# Patient Record
Sex: Male | Born: 2000 | Race: White | Hispanic: No | Marital: Single
Health system: Southern US, Community
[De-identification: ages and names within clinical notes are randomized; demographics above are authoritative.]

---
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
## 2023-06-17 ENCOUNTER — Ambulatory Visit
Admission: RE | Admit: 2023-06-17 | Discharge: 2023-06-17 | Disposition: A | Discharge status: Home / Self Care | Payer: Self-pay | Source: Ambulatory Visit | Attending: Emergency Medicine | Admitting: Emergency Medicine

## 2023-06-17 VITALS — BP 131/78 | HR 100 | Temp 98.7°F | Resp 17

## 2023-06-17 DIAGNOSIS — L03316 Cellulitis of umbilicus: Secondary | ICD-10-CM

## 2023-06-17 MED ORDER — DOXYCYCLINE HYCLATE 100 MG PO CAPS: 100.0000 mg | ORAL_CAPSULE | Freq: Two times a day (BID) | ORAL | 0 refills | Status: AC

## 2023-06-17 NOTE — Discharge Instructions (Addendum)
 Take the Doxycycline twice daily with food for 7 days.  Doxycycline will make you more sensitive to sunburn so wear sunscreen when outdoors and reapply it every 90 minutes.  Apply warm compresses to help promote drainage.  Use OTC Tylenol and Ibuprofen according to the package instructions as needed for pain.  Return for new or worsening symptoms.

## 2023-06-17 NOTE — ED Provider Notes (Signed)
 [facility]    CSN: [id number] Arrival date & time: 06/17/23  1520      History   Chief Complaint Chief Complaint  Patient presents with   Abdominal Pain    Possible umbilical hernia? - Entered by patient   Abscess    HPI Aaron Rhodes is a 23 y.o. male.   HPI  23 year old male with no significant past medical history presents for evaluation of redness, pain, and swelling to his bellybutton that he first noticed 3 days ago.  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  He denies fever but he has had some mild nausea.  He also noticed that he has some serosanguineous drainage from the wound.  History reviewed. No pertinent past medical history.  There are no active problems to display for this patient.   History reviewed. No pertinent surgical history.     Home Medications    Prior to Admission medications   Medication Sig Start Date End Date Taking? Authorizing Provider  doxycycline (VIBRAMYCIN) 100 MG capsule Take 1 capsule (100 mg total) by mouth 2 (two) times daily for 7 days. 06/17/23 06/24/23 Yes Becky Augusta, NP    Family History Family History  Problem Relation Age of Onset   Healthy Mother    Diabetes Father     Social History Social History   Tobacco Use   Smoking status: Passive Smoke Exposure - Never Smoker   Smokeless tobacco: Never   Tobacco comments:    father smokes outside  Vaping Use   Vaping status: Never Used  Substance Use Topics   Alcohol use: Never   Drug use: Never     Allergies   Patient has no known allergies.   Review of Systems Review of Systems  Constitutional:  Negative for fever.  Skin:  Positive for color change.     Physical Exam Triage Vital Signs ED Triage Vitals  Encounter Vitals Group     BP      Systolic BP Percentile      Diastolic BP Percentile      Pulse      Resp      Temp      Temp src      SpO2      Weight       Height      Head Circumference      Peak Flow      Pain Score      Pain Loc      Pain Education      Exclude from Growth Chart    No data found.  Updated Vital Signs BP 131/78 (BP Location: Right Arm)   Pulse 100   Temp 98.7 F (37.1 C) (Oral)   Resp 17   SpO2 97%   Visual Acuity Right Eye Distance:   Left Eye Distance:   Bilateral Distance:    Right Eye Near:   Left Eye Near:    Bilateral Near:     Physical Exam Vitals and nursing note reviewed.  Constitutional:      Appearance: Normal appearance.  Skin:    General: Skin is warm and dry.     Capillary Refill: Capillary refill takes less than 2 seconds.     Findings: Erythema present.  Neurological:     General: No focal deficit present.     Mental Status: He is alert and oriented to person, place, and time.  UC Treatments / Results  Labs (all labs ordered are listed, but only abnormal results are displayed) Labs Reviewed - No data to display  EKG   Radiology No results found.  Procedures Procedures (including critical care time)  Medications Ordered in UC Medications - No data to display  Initial Impression / Assessment and Plan / UC Course  I have reviewed the triage vital signs and the nursing notes.  Pertinent labs & imaging results that were available during my care of the patient were reviewed by me and considered in my medical decision making (see chart for details).   Pleasant, nontoxic-appearing 23 year old male presenting for evaluation of redness, pain, and drainage from his bellybutton that he first noticed 3 days ago.  As you seen image above, the patient's umbilicus is markedly erythematous and edematous with serosanguineous drainage from the bellybutton.  There is also erythema with warmth and mild induration to the abdominal wall underneath the bellybutton.  I will treat the patient for umbilical cellulitis with doxycycline 100 mg twice daily for 7 days.  I have encouraged him to  apply warm compresses to the area to see if he can encourage further drainage of the infection.  He may use over-the-counter Tylenol and/or ibuprofen as needed for any pain.  Return and ER precautions reviewed.  Final Clinical Impressions(s) / UC Diagnoses   Final diagnoses:  Cellulitis, umbilical     Discharge Instructions      Take the Doxycycline twice daily with food for 7 days.  Doxycycline will make you more sensitive to sunburn so wear sunscreen when outdoors and reapply it every 90 minutes.  Apply warm compresses to help promote drainage.  Use OTC Tylenol and Ibuprofen according to the package instructions as needed for pain.  Return for new or worsening symptoms.       ED Prescriptions     Medication Sig Dispense Auth. Provider   doxycycline (VIBRAMYCIN) 100 MG capsule Take 1 capsule (100 mg total) by mouth 2 (two) times daily for 7 days. 14 capsule Becky Augusta, NP      PDMP not reviewed this encounter.   Becky Augusta, NP 06/17/23 1550

## 2023-06-17 NOTE — ED Triage Notes (Addendum)
 Patient has noticed a bump inside belly button x 3 days. Area is red and hard. Patient works outside and sweats a lot.

## 2023-07-18 ENCOUNTER — Ambulatory Visit
Admission: RE | Admit: 2023-07-18 | Discharge: 2023-07-18 | Disposition: A | Discharge status: Home / Self Care | Source: Ambulatory Visit | Attending: Emergency Medicine | Admitting: Emergency Medicine

## 2023-07-18 VITALS — BP 121/68 | HR 80 | Temp 98.3°F | Resp 16

## 2023-07-18 DIAGNOSIS — L03316 Cellulitis of umbilicus: Secondary | ICD-10-CM | POA: Diagnosis not present

## 2023-07-18 MED ORDER — DOXYCYCLINE HYCLATE 100 MG PO CAPS: 100.0000 mg | ORAL_CAPSULE | Freq: Two times a day (BID) | ORAL | 0 refills | Status: AC

## 2023-07-18 MED ORDER — MUPIROCIN 2 % EX OINT: 1.0000 | TOPICAL_OINTMENT | Freq: Two times a day (BID) | CUTANEOUS | 0 refills | Status: DC

## 2023-07-18 NOTE — ED Provider Notes (Signed)
 [facility]    CSN: [id number] Arrival date & time: 07/18/23  1705      History   Chief Complaint Chief Complaint  Patient presents with   Allergic Reaction    Previously diagnosed there as cellulitis in navel - Entered by patient    HPI Aaron Rhodes is a 23 y.o. male.   HPI  23 year old male with no significant past medical history presents for evaluation of pain, redness, and swelling to his bellybutton that he first noticed last night when he got home from work and got the shower.  When he woke up this morning it continued to be red and throughout the day it has become more tender.  He denies any fever or drainage.  He was seen for the same presentation 30 days ago and treated for cellulitis with doxycycline .  History reviewed. No pertinent past medical history.  There are no active problems to display for this patient.   History reviewed. No pertinent surgical history.     Home Medications    Prior to Admission medications   Medication Sig Start Date End Date Taking? Authorizing Provider  doxycycline  (VIBRAMYCIN ) 100 MG capsule Take 1 capsule (100 mg total) by mouth 2 (two) times daily for 10 days. 07/18/23 07/28/23 Yes Kent Pear, NP  mupirocin ointment (BACTROBAN) 2 % Apply 1 Application topically 2 (two) times daily. 07/18/23  Yes Kent Pear, NP    Family History Family History  Problem Relation Age of Onset   Healthy Mother    Diabetes Father     Social History Social History   Tobacco Use   Smoking status: Passive Smoke Exposure - Never Smoker   Smokeless tobacco: Never   Tobacco comments:    father smokes outside  Vaping Use   Vaping status: Never Used  Substance Use Topics   Alcohol use: Never   Drug use: Never     Allergies   Patient has no known allergies.   Review of Systems Review of Systems  Constitutional:  Negative for fever.  Skin:  Positive for color change. Negative for wound.     Physical Exam Triage  Vital Signs ED Triage Vitals  Encounter Vitals Group     BP      Systolic BP Percentile      Diastolic BP Percentile      Pulse      Resp      Temp      Temp src      SpO2      Weight      Height      Head Circumference      Peak Flow      Pain Score      Pain Loc      Pain Education      Exclude from Growth Chart    No data found.  Updated Vital Signs BP 121/68 (BP Location: Right Arm)   Pulse 80   Temp 98.3 F (36.8 C) (Oral)   Resp 16   SpO2 98%   Visual Acuity Right Eye Distance:   Left Eye Distance:   Bilateral Distance:    Right Eye Near:   Left Eye Near:    Bilateral Near:     Physical Exam Vitals and nursing note reviewed.  Constitutional:      Appearance: Normal appearance. He is not ill-appearing.  HENT:     Head: Normocephalic and atraumatic.  Skin:    General: Skin is warm and dry.  Capillary Refill: Capillary refill takes less than 2 seconds.     Findings: Erythema present.  Neurological:     General: No focal deficit present.     Mental Status: He is alert and oriented to person, place, and time.      UC Treatments / Results  Labs (all labs ordered are listed, but only abnormal results are displayed) Labs Reviewed - No data to display  EKG   Radiology No results found.  Procedures Procedures (including critical care time)  Medications Ordered in UC Medications - No data to display  Initial Impression / Assessment and Plan / UC Course  I have reviewed the triage vital signs and the nursing notes.  Pertinent labs & imaging results that were available during my care of the patient were reviewed by me and considered in my medical decision making (see chart for details).   Patient is a pleasant, nontoxic-appearing 23 year old male presenting for evaluation of erythema to his umbilical region as outlined HPI above.  Patient was seen image above, the umbilicus itself is erythematous and there is erythema surrounding the  inferior tissue.  This tissue is warm to touch but there is no induration or fluctuance.  I do not appreciate any wounds on the umbilicus that might have been portal of entry for bacteria.  I will treat the patient for cellulitis of his umbilicus with a 10-day course of doxycycline  100 looms twice daily as well as a 5-day course of twice daily mupirocin topical antibiotic ointment.  Return precautions reviewed.   Final Clinical Impressions(s) / UC Diagnoses   Final diagnoses:  Cellulitis of umbilicus     Discharge Instructions      Take the doxycycline  twice daily with food for 10 days for treatment of your infected bellybutton.  Apply mupirocin ointment using a clean Q-tip twice daily to your bellybutton for the next 5 days.  Monitor for any increases in redness, swelling, pus drainage, or fever.  If any of these conditions arise please return for reevaluation.     ED Prescriptions     Medication Sig Dispense Auth. Provider   doxycycline  (VIBRAMYCIN ) 100 MG capsule Take 1 capsule (100 mg total) by mouth 2 (two) times daily for 10 days. 20 capsule Kent Pear, NP   mupirocin ointment (BACTROBAN) 2 % Apply 1 Application topically 2 (two) times daily. 22 g Kent Pear, NP      PDMP not reviewed this encounter.   Kent Pear, NP 07/18/23 [phone]

## 2023-07-18 NOTE — ED Triage Notes (Signed)
 Skin infection Belly button  Hot to touch  Came about a month ago for the same reason

## 2023-07-18 NOTE — Discharge Instructions (Signed)
 Take the doxycycline  twice daily with food for 10 days for treatment of your infected bellybutton.  Apply mupirocin ointment using a clean Q-tip twice daily to your bellybutton for the next 5 days.  Monitor for any increases in redness, swelling, pus drainage, or fever.  If any of these conditions arise please return for reevaluation.

## 2024-03-26 ENCOUNTER — Encounter: Payer: Self-pay | Admitting: Emergency Medicine

## 2024-03-26 ENCOUNTER — Ambulatory Visit
Admission: EM | Admit: 2024-03-26 | Discharge: 2024-03-26 | Disposition: A | Discharge status: Home / Self Care | Attending: Emergency Medicine | Admitting: Emergency Medicine

## 2024-03-26 DIAGNOSIS — J111 Influenza due to unidentified influenza virus with other respiratory manifestations: Secondary | ICD-10-CM

## 2024-03-26 MED ORDER — BENZONATATE 100 MG PO CAPS: 200.0000 mg | ORAL_CAPSULE | Freq: Three times a day (TID) | ORAL | 0 refills | Status: AC

## 2024-03-26 MED ORDER — PROMETHAZINE-DM 6.25-15 MG/5ML PO SYRP: 5.0000 mL | ORAL_SOLUTION | Freq: Four times a day (QID) | ORAL | 0 refills | Status: AC | PRN

## 2024-03-26 MED ORDER — OSELTAMIVIR PHOSPHATE 75 MG PO CAPS: 75.0000 mg | ORAL_CAPSULE | Freq: Two times a day (BID) | ORAL | 0 refills | Status: AC

## 2024-03-26 MED ORDER — IPRATROPIUM BROMIDE 0.06 % NA SOLN: 2.0000 | Freq: Four times a day (QID) | NASAL | 12 refills | Status: AC

## 2024-03-26 NOTE — Discharge Instructions (Signed)
 We are treating you presumptively for influenza given your flu exposure and your symptoms.  Please use over-the-counter Tylenol and or ibuprofen according to the package instructions as needed for any fever or pain.  Take the Tamiflu  twice daily for 5 days for treatment of influenza.  Use the Atrovent  nasal spray, 2 squirts up each nostril every 6 hours, as needed for nasal congestion and runny nose.  Use over-the-counter Delsym, Zarbee's, or Robitussin during the day as needed for cough.  Use the Tessalon  Perles every 8 hours as needed for cough.  Taken with a small sip of water.  You may experience some numbness to your tongue or metallic taste in your mouth, this is normal.  Use the Promethazine  DM cough syrup at bedtime as will make you drowsy but it should help dry up your postnasal drip and aid you in sleep and cough relief.  Return for reevaluation, or see your primary care provider, for new or worsening symptoms.

## 2024-03-26 NOTE — ED Triage Notes (Addendum)
 Pt c/o body aches, nasal congestion, headache, and fever. Started last night. He has been in contact with someone that tested positive for the flu.

## 2024-03-26 NOTE — ED Provider Notes (Signed)
 " [facility]    CSN: [id number] Arrival date & time: 03/26/24  0907      History   Chief Complaint Chief Complaint  Patient presents with   Generalized Body Aches   Fever    HPI Aaron Rhodes is a 24 y.o. male.   HPI  24 year old male with no significant past medical history presents for evaluation of flulike symptoms that began last night after having a flu exposure.  He reports a fever with a Tmax of 102.9.  Headaches, body aches, nasal congestion with thick clear nasal discharge, productive cough intermittently for clear sputum.  No shortness of breath or wheezing.  History reviewed. No pertinent past medical history.  There are no active problems to display for this patient.   History reviewed. No pertinent surgical history.     Home Medications    Prior to Admission medications  Medication Sig Start Date End Date Taking? Authorizing Provider  benzonatate  (TESSALON ) 100 MG capsule Take 2 capsules (200 mg total) by mouth every 8 (eight) hours. 03/26/24  Yes Bernardino Ditch, NP  ipratropium (ATROVENT ) 0.06 % nasal spray Place 2 sprays into both nostrils 4 (four) times daily. 03/26/24  Yes Bernardino Ditch, NP  oseltamivir  (TAMIFLU ) 75 MG capsule Take 1 capsule (75 mg total) by mouth every 12 (twelve) hours. 03/26/24  Yes Bernardino Ditch, NP  promethazine -dextromethorphan (PROMETHAZINE -DM) 6.25-15 MG/5ML syrup Take 5 mLs by mouth 4 (four) times daily as needed. 03/26/24  Yes Bernardino Ditch, NP    Family History Family History  Problem Relation Age of Onset   Healthy Mother    Diabetes Father     Social History Social History[1]   Allergies   Patient has no known allergies.   Review of Systems Review of Systems  Constitutional:  Positive for chills and fever.  HENT:  Positive for congestion and rhinorrhea. Negative for ear pain and sore throat.   Respiratory:  Positive for cough. Negative for shortness of breath and wheezing.   Musculoskeletal:  Positive  for arthralgias and myalgias.  Neurological:  Positive for headaches.     Physical Exam Triage Vital Signs ED Triage Vitals  Encounter Vitals Group     BP      Girls Systolic BP Percentile      Girls Diastolic BP Percentile      Boys Systolic BP Percentile      Boys Diastolic BP Percentile      Pulse      Resp      Temp      Temp src      SpO2      Weight      Height      Head Circumference      Peak Flow      Pain Score      Pain Loc      Pain Education      Exclude from Growth Chart    No data found.  Updated Vital Signs BP 112/68 (BP Location: Right Arm)   Pulse (!) 118   Temp 99.5 F (37.5 C) (Oral)   Resp 16   Ht 5' 4 (1.626 m)   Wt 180 lb (81.6 kg)   SpO2 95%   BMI 30.90 kg/m   Visual Acuity Right Eye Distance:   Left Eye Distance:   Bilateral Distance:    Right Eye Near:   Left Eye Near:    Bilateral Near:     Physical Exam Vitals and nursing note  reviewed.  Constitutional:      Appearance: Normal appearance. He is not ill-appearing.  HENT:     Head: Normocephalic and atraumatic.     Right Ear: Tympanic membrane, ear canal and external ear normal. There is no impacted cerumen.     Left Ear: Tympanic membrane, ear canal and external ear normal. There is no impacted cerumen.     Nose: Congestion and rhinorrhea present.     Comments: Nasal mucosa is edematous and erythematous with clear discharge in both nares.    Mouth/Throat:     Mouth: Mucous membranes are moist.     Pharynx: Oropharynx is clear. No oropharyngeal exudate or posterior oropharyngeal erythema.  Cardiovascular:     Rate and Rhythm: Normal rate and regular rhythm.     Pulses: Normal pulses.     Heart sounds: Normal heart sounds. No murmur heard.    No friction rub. No gallop.  Pulmonary:     Effort: Pulmonary effort is normal.     Breath sounds: Normal breath sounds. No wheezing, rhonchi or rales.  Musculoskeletal:     Cervical back: Normal range of motion and neck supple.  No tenderness.  Lymphadenopathy:     Cervical: No cervical adenopathy.  Skin:    General: Skin is warm and dry.     Capillary Refill: Capillary refill takes less than 2 seconds.     Findings: No rash.  Neurological:     General: No focal deficit present.     Mental Status: He is alert and oriented to person, place, and time.      UC Treatments / Results  Labs (all labs ordered are listed, but only abnormal results are displayed) Labs Reviewed - No data to display  EKG   Radiology No results found.  Procedures Procedures (including critical care time)  Medications Ordered in UC Medications - No data to display  Initial Impression / Assessment and Plan / UC Course  I have reviewed the triage vital signs and the nursing notes.  Pertinent labs & imaging results that were available during my care of the patient were reviewed by me and considered in my medical decision making (see chart for details).   Patient is a nontoxic-appearing 24 year old male presenting for evaluation of flulike symptoms after suffering a flu exposure.  He reports that he took Tylenol this morning for fever and bodyaches.  His temperature remains elevated 99.5.  He reports a Tmax yesterday of 102.9.  Given the shortage of flu test I will not test for influenza at this time but I will treat him presumptively given his exposure.  I will start him on Tamiflu  75 mg twice daily for 5 days along with Atrovent  nasal spray for nasal congestion and Tessalon  Perles and Promethazine  DM cough syrup for cough and congestion.  Return precautions reviewed.  Work note provided.   Final Clinical Impressions(s) / UC Diagnoses   Final diagnoses:  Influenza-like illness     Discharge Instructions      We are treating you presumptively for influenza given your flu exposure and your symptoms.  Please use over-the-counter Tylenol and or ibuprofen according to the package instructions as needed for any fever or  pain.  Take the Tamiflu  twice daily for 5 days for treatment of influenza.  Use the Atrovent  nasal spray, 2 squirts up each nostril every 6 hours, as needed for nasal congestion and runny nose.  Use over-the-counter Delsym, Zarbee's, or Robitussin during the day as needed for cough.  Use the Tessalon  Perles every 8 hours as needed for cough.  Taken with a small sip of water.  You may experience some numbness to your tongue or metallic taste in your mouth, this is normal.  Use the Promethazine  DM cough syrup at bedtime as will make you drowsy but it should help dry up your postnasal drip and aid you in sleep and cough relief.  Return for reevaluation, or see your primary care provider, for new or worsening symptoms.      ED Prescriptions     Medication Sig Dispense Auth. Provider   benzonatate  (TESSALON ) 100 MG capsule Take 2 capsules (200 mg total) by mouth every 8 (eight) hours. 21 capsule Bernardino Ditch, NP   ipratropium (ATROVENT ) 0.06 % nasal spray Place 2 sprays into both nostrils 4 (four) times daily. 15 mL Bernardino Ditch, NP   promethazine -dextromethorphan (PROMETHAZINE -DM) 6.25-15 MG/5ML syrup Take 5 mLs by mouth 4 (four) times daily as needed. 118 mL Bernardino Ditch, NP   oseltamivir  (TAMIFLU ) 75 MG capsule Take 1 capsule (75 mg total) by mouth every 12 (twelve) hours. 10 capsule Bernardino Ditch, NP      PDMP not reviewed this encounter.    [1]  Social History Tobacco Use   Smoking status: Passive Smoke Exposure - Never Smoker   Smokeless tobacco: Never   Tobacco comments:    father smokes outside  Vaping Use   Vaping status: Never Used  Substance Use Topics   Alcohol use: Never   Drug use: Never     Bernardino Ditch, NP 03/26/24 0930  "
